# Patient Record
Sex: Male | Born: 1955
Health system: Southern US, Community
[De-identification: ages and names within clinical notes are randomized; demographics above are authoritative.]

## PROBLEM LIST (undated history)

## (undated) DIAGNOSIS — E785 Hyperlipidemia, unspecified: Secondary | ICD-10-CM

## (undated) DIAGNOSIS — G479 Sleep disorder, unspecified: Secondary | ICD-10-CM

## (undated) DIAGNOSIS — R05 Cough: Secondary | ICD-10-CM

## (undated) DIAGNOSIS — G5600 Carpal tunnel syndrome, unspecified upper limb: Secondary | ICD-10-CM

## (undated) DIAGNOSIS — R0600 Dyspnea, unspecified: Secondary | ICD-10-CM

## (undated) HISTORY — PX: CARPAL TUNNEL RELEASE: SHX101

## (undated) HISTORY — DX: Cough: R05

## (undated) HISTORY — PX: NASAL SINUS SURGERY: SHX719

## (undated) HISTORY — DX: Carpal tunnel syndrome, unspecified upper limb: G56.00

## (undated) HISTORY — DX: Sleep disorder, unspecified: G47.9

## (undated) HISTORY — DX: Hyperlipidemia, unspecified: E78.5

## (undated) HISTORY — DX: Dyspnea, unspecified: R06.00

---

## 1983-09-18 HISTORY — PX: HAND SURGERY: SHX662

## 2000-08-19 ENCOUNTER — Encounter (INDEPENDENT_AMBULATORY_CARE_PROVIDER_SITE_OTHER): Payer: Self-pay | Admitting: Specialist

## 2000-08-19 ENCOUNTER — Other Ambulatory Visit: Admission: RE | Admit: 2000-08-19 | Discharge: 2000-08-19 | Payer: Self-pay | Admitting: *Deleted

## 2000-08-24 ENCOUNTER — Emergency Department (HOSPITAL_COMMUNITY): Admission: EM | Admit: 2000-08-24 | Discharge: 2000-08-24 | Payer: Self-pay | Admitting: Emergency Medicine

## 2001-12-08 ENCOUNTER — Emergency Department (HOSPITAL_COMMUNITY): Admission: EM | Admit: 2001-12-08 | Discharge: 2001-12-08 | Payer: Self-pay | Admitting: Emergency Medicine

## 2002-09-28 ENCOUNTER — Emergency Department (HOSPITAL_COMMUNITY): Admission: EM | Admit: 2002-09-28 | Discharge: 2002-09-28 | Payer: Self-pay | Admitting: Emergency Medicine

## 2002-09-28 ENCOUNTER — Encounter: Payer: Self-pay | Admitting: Emergency Medicine

## 2002-10-09 ENCOUNTER — Ambulatory Visit (HOSPITAL_COMMUNITY): Admission: RE | Admit: 2002-10-09 | Discharge: 2002-10-09 | Payer: Self-pay | Admitting: Family Medicine

## 2002-10-09 ENCOUNTER — Encounter: Payer: Self-pay | Admitting: Family Medicine

## 2003-08-23 ENCOUNTER — Ambulatory Visit (HOSPITAL_BASED_OUTPATIENT_CLINIC_OR_DEPARTMENT_OTHER): Admission: RE | Admit: 2003-08-23 | Discharge: 2003-08-23 | Payer: Self-pay | Admitting: Orthopedic Surgery

## 2003-08-23 ENCOUNTER — Ambulatory Visit (HOSPITAL_COMMUNITY): Admission: RE | Admit: 2003-08-23 | Discharge: 2003-08-23 | Payer: Self-pay | Admitting: Orthopedic Surgery

## 2005-01-17 ENCOUNTER — Ambulatory Visit: Payer: Self-pay | Admitting: Gastroenterology

## 2005-02-23 ENCOUNTER — Ambulatory Visit: Payer: Self-pay | Admitting: Gastroenterology

## 2005-02-23 ENCOUNTER — Encounter (INDEPENDENT_AMBULATORY_CARE_PROVIDER_SITE_OTHER): Payer: Self-pay | Admitting: *Deleted

## 2008-05-14 ENCOUNTER — Ambulatory Visit (HOSPITAL_COMMUNITY): Admission: RE | Admit: 2008-05-14 | Discharge: 2008-05-14 | Payer: Self-pay | Admitting: Gastroenterology

## 2010-01-27 ENCOUNTER — Encounter (INDEPENDENT_AMBULATORY_CARE_PROVIDER_SITE_OTHER): Payer: Self-pay | Admitting: *Deleted

## 2010-10-17 NOTE — Letter (Signed)
Summary: Colonoscopy Letter  [facility]  [address] [place], [place] [address]   Phone: [phone]  Fax: [phone]      Jan 27, 2010 MRN: [id number]   Keith Wilson [address] [place], [place]  [address]   Dear [name],   According to your medical record, it is time for you to schedule a Colonoscopy. The American Cancer Society recommends this procedure as a method to detect early colon cancer. Patients with a family history of colon cancer, or a personal history of colon polyps or inflammatory bowel disease are at increased risk.  This letter has beeen generated based on the recommendations made at the time of your procedure. If you feel that in your particular situation this may no longer apply, please contact our office.  Please call our office at [phone] to schedule this appointment or to update your records at your earliest convenience.  Thank you for cooperating with [place] to provide you with the very best care possible.   Sincerely,  Judie Petit T. Russella Dar, M.D.  [facility] [phone]

## 2011-02-02 NOTE — Op Note (Signed)
NAME:  Keith Wilson, Keith Wilson                        ACCOUNT NO.:  [id number]   MEDICAL RECORD NO.:  [id number]                   PATIENT TYPE:  AMB   LOCATION:  DSC                                  FACILITY:  [facility]   PHYSICIAN:  Robert A. Thurston Hole, M.D.              DATE OF BIRTH:  [birth year]   DATE OF PROCEDURE:  08/23/2003  DATE OF DISCHARGE:                                 OPERATIVE REPORT   PREOPERATIVE DIAGNOSIS:  1. Left carpal tunnel syndrome.  2. Right carpal tunnel syndrome.   POSTOPERATIVE DIAGNOSIS:  1. Left carpal tunnel syndrome.  2. Right carpal tunnel syndrome.   PROCEDURE:  1. Left carpal tunnel release.  2. Right carpal tunnel release.   SURGEON:  Elana Alm. Thurston Hole, M.D.   ASSISTANT:  Julien Girt, P.A.   ANESTHESIA:  General.   OPERATIVE TIME:  40 minutes.   COMPLICATIONS:  None.   INDICATIONS FOR PROCEDURE:  Mr. Dibello is a 55 year old gentleman  who has  had significant painful progressive bilateral carpal tunnel syndrome that  has not been resolved by conservative care and he is now to undergo  bilateral carpal tunnel release. This has been documented by EMG, PNCV  evaluations.   DESCRIPTION OF PROCEDURE:  Mr. Fillinger was brought to the operating room on  August 23, 2003, and placed on the operating table in the supine position.  After an adequate level of general anesthesia was obtained, both hands and  arms were prepped using sterile Duraprep and draped using sterile technique.  He received 1 gm of Ancef IV preoperatively for prophylaxis.   Initially the right hand was addressed. The right hand and arm were prepped  and draped and the exsanguinated and a forearm tourniquet was elevated to  250 mmHg.  Initially through a 3 to 4-cm palmar incision, initial exposure  was made. The underlying subcutaneous tissues were incised along with the  skin incision. The transverse carpal ligament was exposed at the level  of  the wrist flexion crease,  carefully  incising this and  protecting the  underlying median nerve with a hemostat. The entire transverse carpal  ligament was released distally to the level of the superficial palmar arch,  carefully protecting this and released proximally  approximately  3 inches  proximal  to the wrist flexion crease, carefully  protecting the palmar  cutaneous branch of the median nerve.   The median nerve was found to be significantly flattened and compressed but  no other  pathology was noted. The wound was then irrigated and closed using  interrupted 3-0 nylon sutures. The wound was injected with 0.25% Marcaine.  After this was done, sterile compressive dressings were applied and the  tourniquet was released.   Attention was turned to the left hand. The arm was exsanguinated and a  forearm tourniquet elevated to 250 mmHg.  Initially through a 4-cm palmar  incision, initial exposure was made. The  underlying subcutaneous tissues  were incised along with the skin  incision. The transverse carpal ligament  was exposed at the level of the wrist flexion crease and incised. A hemostat  was used to protect the median nerve while the entire transverse carpal  ligament was released distally to the level of the superficial palmar arch,  carefully protecting this and released proximally approximately 3 inches  proximal to the wrist flexion crease, carefully protecting the palmar  cutaneous branch of the median nerve.   Again the median nerve was found to be significantly flattened and  compressed but no other  pathology was noted. At this point this wound was  irrigated then closed using interrupted 3-0 nylon suture and injected with  0.25% Marcaine. Sterile compressive dressings were applied. The tourniquet  was released.   The patient was then awakened and taken to the recovery room in stable  condition. For followup care Mr. Biederman will be followed  as an outpatient  on Percocet  and Naprosyn. He  will see me back in the office in one week for  sutures out and followup.                                               Robert A. Thurston Hole, M.D.    RAW/MEDQ  D:  08/23/2003  T:  08/23/2003  Job:  [id number]

## 2011-04-26 ENCOUNTER — Telehealth: Payer: Self-pay

## 2011-04-26 NOTE — Telephone Encounter (Signed)
Called patient to schedule recall Colonoscopy and the home phone number did not ring and the work number states the patient no longer works there.

## 2012-04-11 ENCOUNTER — Encounter: Payer: Self-pay | Admitting: Gastroenterology

## 2012-10-15 ENCOUNTER — Other Ambulatory Visit: Payer: Self-pay | Admitting: Physician Assistant

## 2014-09-15 ENCOUNTER — Other Ambulatory Visit (HOSPITAL_COMMUNITY): Payer: Self-pay | Admitting: Physician Assistant

## 2014-09-15 ENCOUNTER — Encounter (HOSPITAL_COMMUNITY): Payer: Self-pay | Admitting: *Deleted

## 2014-09-15 DIAGNOSIS — R0609 Other forms of dyspnea: Principal | ICD-10-CM

## 2014-09-15 DIAGNOSIS — R0600 Dyspnea, unspecified: Secondary | ICD-10-CM

## 2014-09-23 ENCOUNTER — Encounter (HOSPITAL_COMMUNITY): Payer: Self-pay | Admitting: *Deleted

## 2014-10-06 ENCOUNTER — Telehealth (HOSPITAL_COMMUNITY): Payer: Self-pay

## 2014-10-06 NOTE — Telephone Encounter (Signed)
Encounter complete. 

## 2014-10-07 ENCOUNTER — Telehealth (HOSPITAL_COMMUNITY): Payer: Self-pay

## 2014-10-07 NOTE — Telephone Encounter (Signed)
Encounter complete. 

## 2014-10-08 ENCOUNTER — Encounter (HOSPITAL_COMMUNITY): Payer: Self-pay

## 2014-10-11 ENCOUNTER — Encounter (HOSPITAL_COMMUNITY): Payer: Self-pay

## 2014-12-15 ENCOUNTER — Telehealth (HOSPITAL_COMMUNITY): Payer: Self-pay

## 2014-12-15 NOTE — Telephone Encounter (Signed)
Encounter complete. 

## 2014-12-16 ENCOUNTER — Ambulatory Visit (HOSPITAL_COMMUNITY)
Admission: RE | Admit: 2014-12-16 | Discharge: 2014-12-16 | Disposition: A | Discharge status: Home / Self Care | Payer: BLUE CROSS/BLUE SHIELD | Source: Ambulatory Visit | Attending: Physician Assistant | Admitting: Physician Assistant

## 2014-12-16 DIAGNOSIS — R0600 Dyspnea, unspecified: Secondary | ICD-10-CM

## 2014-12-16 DIAGNOSIS — R0609 Other forms of dyspnea: Secondary | ICD-10-CM

## 2014-12-16 NOTE — Procedures (Signed)
Exercise Treadmill Test  Test  Exercise Tolerance Test Ordering MD: Lovenia KimMark Hepler, PA-C  Interpreting MD:   Unique Test No: 1 Treadmill:  1  Indication for ETT: exertional dyspnea  Contraindication to ETT: No   Stress Modality: exercise - treadmill  Cardiac Imaging Performed: non   Protocol: standard Bruce - maximal  Max BP:  166/81  Max MPHR (bpm):  164 85% MPR (bpm):  139  MPHR obtained (bpm):  169 % MPHR obtained:  103  Reached 85% MPHR (min:sec):  9:20 Total Exercise Time (min-sec):  11:00  Workload in METS:  13.40 Borg Scale:   Reason ETT Terminated:  fatigue    ST Segment Analysis At Rest: normal ST segments - no evidence of significant ST depression With Exercise: no evidence of significant ST depression  Other Information Arrhythmia:  No Angina during ETT:  absent (0) Quality of ETT:  diagnostic  ETT Interpretation:  normal - no evidence of ischemia by ST analysis  Comments: Excellent exercise tolerance No chest pain Normal BP response to exercise  Chrystie NoseKenneth C. Loy Little, MD, [facility] SystemFACC Attending Cardiologist [facility]

## 2014-12-23 ENCOUNTER — Encounter (HOSPITAL_COMMUNITY): Payer: Self-pay

## 2015-10-18 ENCOUNTER — Institutional Professional Consult (permissible substitution): Payer: BLUE CROSS/BLUE SHIELD | Admitting: Internal Medicine

## 2015-10-27 ENCOUNTER — Encounter: Payer: Self-pay | Admitting: Internal Medicine

## 2015-10-27 ENCOUNTER — Ambulatory Visit (INDEPENDENT_AMBULATORY_CARE_PROVIDER_SITE_OTHER)
Admission: RE | Admit: 2015-10-27 | Discharge: 2015-10-27 | Disposition: A | Discharge status: Home / Self Care | Payer: BLUE CROSS/BLUE SHIELD | Source: Ambulatory Visit | Attending: Internal Medicine | Admitting: Internal Medicine

## 2015-10-27 ENCOUNTER — Ambulatory Visit (INDEPENDENT_AMBULATORY_CARE_PROVIDER_SITE_OTHER): Payer: BLUE CROSS/BLUE SHIELD | Admitting: Internal Medicine

## 2015-10-27 VITALS — BP 104/80 | HR 78 | Ht 72.0 in | Wt 206.0 lb

## 2015-10-27 DIAGNOSIS — R05 Cough: Secondary | ICD-10-CM | POA: Diagnosis not present

## 2015-10-27 DIAGNOSIS — R0600 Dyspnea, unspecified: Secondary | ICD-10-CM

## 2015-10-27 DIAGNOSIS — R058 Other specified cough: Secondary | ICD-10-CM

## 2015-10-27 DIAGNOSIS — R0609 Other forms of dyspnea: Secondary | ICD-10-CM | POA: Insufficient documentation

## 2015-10-27 HISTORY — DX: Other specified cough: R05.8

## 2015-10-27 HISTORY — DX: Dyspnea, unspecified: R06.00

## 2015-10-27 MED ORDER — FAMOTIDINE 20 MG PO TABS: ORAL_TABLET | ORAL | Status: AC

## 2015-10-27 MED ORDER — PANTOPRAZOLE SODIUM 40 MG PO TBEC: 40.0000 mg | DELAYED_RELEASE_TABLET | Freq: Every day | ORAL | Status: DC

## 2015-10-27 NOTE — Patient Instructions (Addendum)
Please see patient coordinator before you leave today  to schedule for sinus CT   Pantoprazole (protonix) 40 mg   Take  30-60 min before first meal of the day and Pepcid (famotidine)  20 mg one @  bedtime until return to office - this is the best way to tell whether stomach acid is contributing to your problem.    GERD (REFLUX)  is an extremely common cause of respiratory symptoms just like yours , many times with no obvious heartburn at all.    It can be treated with medication, but also with lifestyle changes including elevation of the head of your bed (ideally with 6 inch  bed blocks),  Smoking cessation, avoidance of late meals, excessive alcohol, and avoid fatty foods, chocolate, peppermint, colas, red wine, and acidic juices such as orange juice.  NO MINT OR MENTHOL PRODUCTS SO NO COUGH DROPS  USE SUGARLESS CANDY INSTEAD (Jolley ranchers or Stover's or Life Savers) or even ice chips will also do - the key is to swallow to prevent all throat clearing. NO OIL BASED VITAMINS - use powdered substitutes.  Please remember to go to the  x-ray department downstairs for your tests - we will call you with the results when they are available.     If not better in 2 weeks you need to call [facility]  for CPST with spirometry before and after - call [phone]

## 2015-10-27 NOTE — Progress Notes (Signed)
Subjective:    Patient ID: Keith Wilson, male    DOB: 24-Dec-1955,   MRN: [id number]  HPI  49 yowm quit smoking 2007 with h/o sinus problems requiring surgery but that helped a lot and no breathing problem when quit but onset of doe x winter 2016 already eval with neg gxt 11/2014 did not cause sob but fatigue but since then every time gets in a hurry sob or takes on incline  So referred to pulmonary clinic 10/27/2015 by Dr Tommi Emery with nl spirometry 10/27/2015    10/27/2015 1st [facility] office visit/ Kamir Selover   Chief Complaint  Patient presents with  . Pulmonary Consult    Referred by Lovenia Kim.  Pt c/o DOE for the past year. He gets SOB walking up any incline. He also c/o occ CP when he lies down.   occ noct cp / pos am cough congestion clear phlegm  Doe x one year min progression = MMRC1 = can walk nl pace, flat grade, can't hurry or go uphills or steps s sob   GXT 12/16/2014 nl ex tol, stopped due to fatigue, not sob  Neg pleuritic ap  No obvious other patterns in day to day or daytime variabilty or assoc  chest tightness, subjective wheeze overt sinus or hb symptoms. No unusual exp hx or h/o childhood pna/ asthma or knowledge of premature birth.  Sleeping ok without nocturnal  or early am exacerbation  of respiratory  c/o's or need for noct saba. Also denies any obvious fluctuation of symptoms with weather or environmental changes or other aggravating or alleviating factors except as outlined above   Current Medications, Allergies, Complete Past Medical History, Past Surgical History, Family History, and Social History were reviewed in [employer] record.               Review of Systems  Constitutional: Negative for fever, chills, activity change, appetite change and unexpected weight change.  HENT: Negative for congestion, dental problem, postnasal drip, rhinorrhea, sneezing, sore throat, trouble swallowing and voice change.   Eyes: Negative for visual  disturbance.  Respiratory: Positive for shortness of breath. Negative for cough and choking.   Cardiovascular: Positive for chest pain. Negative for leg swelling.  Gastrointestinal: Negative for nausea, vomiting and abdominal pain.  Genitourinary: Negative for difficulty urinating.  Musculoskeletal: Negative for arthralgias.  Skin: Negative for rash.  Psychiatric/Behavioral: Negative for behavioral problems and confusion.       Objective:   Physical Exam amb wm nad with severe nasal tone to voice and noisy nasal breathing heard across the room   Wt Readings from Last 3 Encounters:  10/27/15 206 lb (93.441 kg)    Vital signs reviewed    HEENT: nl dentition,  and oropharynx. Nl external ear canals without cough reflex - severe bilateral non specific turbinate edema   NECK :  without JVD/Nodes/TM/ nl carotid upstrokes bilaterally   LUNGS: no acc muscle use,  Nl contour chest which is clear to A and P bilaterally without cough on insp or exp maneuvers   CV:  RRR  no s3 or murmur or increase in P2, no edema   ABD:  soft and nontender with nl inspiratory excursion in the supine position. No bruits or organomegaly, bowel sounds nl  MS:  Nl gait/ ext warm without deformities, calf tenderness, cyanosis or clubbing No obvious joint restrictions   SKIN: warm and dry without lesions    NEURO:  alert, approp, nl sensorium with  no motor deficits      I personally reviewed images and agree with radiology impression as follows:  CXR:  10/27/2015  Cardiac shadow is stable. The lungs are well aerated bilaterally. Mild deformity of the anterior aspect of the left sixth rib is noted this is stable in appearance from the prior exam. No acute abnormality is noted.       Assessment & Plan:

## 2015-10-28 ENCOUNTER — Encounter: Payer: Self-pay | Admitting: Internal Medicine

## 2015-10-28 NOTE — Assessment & Plan Note (Addendum)
12/16/14   GXT  = nl ex tolerance/ stopped due to fatigue/ not sob  10/27/2015  Walked RA x 3 laps @ 185 ft each stopped due to End of study,   fast  pace, no sob or desat - Spirometry 10/27/2015  Completely wnl    Symptoms are markedly disproportionate to objective findings and not clear this is a lung problem but pt does appear to have difficult airway management issues.   DDX of  difficult airways management almost all start with A and  include Adherence, Ace Inhibitors, Acid Reflux, Active Sinus Disease, Alpha 1 Antitripsin deficiency, Anxiety masquerading as Airways dz,  ABPA,  Allergy(esp in young), Aspiration (esp in elderly), Adverse effects of meds,  Active smokers, A bunch of PE's (a small clot burden can't cause this syndrome unless there is already severe underlying pulm or vascular dz with poor reserve) plus two Bs  = Bronchiectasis and Beta blocker use..and one C= CHF  ? Acid (or non-acid) GERD > always difficult to exclude as up to 75% of pts in some series report no assoc GI/ Heartburn symptoms and this may explain some of his noct cps also since don't occur with ex > rec max (24h)  acid suppression and diet restrictions/ reviewed and instructions given in writing.   ? Allergies/ asthma very unlikely s variability with environment  ? Anxiety > usually at the bottom of this list of usual suspects but should be much higher on this pt's based on H and P and note already on psychotropics .   ? Active sinus dz > ct sinus   ? Chf/ cardiac > ruled out already by Cards   I had an extended discussion with the patient reviewing all relevant studies completed to date and  lasting 35  minutes of a 60  minute visit    Each maintenance medication was reviewed in detail including most importantly the difference between maintenance and prns and under what circumstances the prns are to be triggered using an action plan format that is not reflected in the computer generated alphabetically organized  AVS.    Please see instructions for details which were reviewed in writing and the patient given a copy highlighting the part that I personally wrote and discussed at today's ov.

## 2015-10-28 NOTE — Progress Notes (Signed)
Quick Note:  LMTCB ______ 

## 2015-10-28 NOTE — Assessment & Plan Note (Signed)
His nasal exam is the most impressive finding > check sinus CT ? Consider allergy eval

## 2015-10-31 NOTE — Progress Notes (Signed)
Quick Note:  Spoke with pt and notified of results per Dr. Wert. Pt verbalized understanding and denied any questions.  ______ 

## 2015-11-01 ENCOUNTER — Inpatient Hospital Stay: Admission: RE | Admit: 2015-11-01 | Payer: BLUE CROSS/BLUE SHIELD | Source: Ambulatory Visit

## 2016-09-15 DIAGNOSIS — H5213 Myopia, bilateral: Secondary | ICD-10-CM | POA: Diagnosis not present

## 2016-09-15 DIAGNOSIS — H52223 Regular astigmatism, bilateral: Secondary | ICD-10-CM | POA: Diagnosis not present

## 2016-11-21 DIAGNOSIS — Z125 Encounter for screening for malignant neoplasm of prostate: Secondary | ICD-10-CM | POA: Diagnosis not present

## 2016-11-21 DIAGNOSIS — Z0000 Encounter for general adult medical examination without abnormal findings: Secondary | ICD-10-CM | POA: Diagnosis not present

## 2016-11-21 DIAGNOSIS — E782 Mixed hyperlipidemia: Secondary | ICD-10-CM | POA: Diagnosis not present

## 2017-01-21 DIAGNOSIS — R972 Elevated prostate specific antigen [PSA]: Secondary | ICD-10-CM | POA: Diagnosis not present

## 2017-01-21 DIAGNOSIS — N401 Enlarged prostate with lower urinary tract symptoms: Secondary | ICD-10-CM | POA: Diagnosis not present

## 2017-01-21 DIAGNOSIS — R350 Frequency of micturition: Secondary | ICD-10-CM | POA: Diagnosis not present

## 2017-05-29 DIAGNOSIS — L02219 Cutaneous abscess of trunk, unspecified: Secondary | ICD-10-CM | POA: Diagnosis not present

## 2017-11-14 DIAGNOSIS — J018 Other acute sinusitis: Secondary | ICD-10-CM | POA: Diagnosis not present

## 2017-11-27 DIAGNOSIS — Z131 Encounter for screening for diabetes mellitus: Secondary | ICD-10-CM | POA: Diagnosis not present

## 2017-11-27 DIAGNOSIS — J069 Acute upper respiratory infection, unspecified: Secondary | ICD-10-CM | POA: Diagnosis not present

## 2017-11-27 DIAGNOSIS — Z125 Encounter for screening for malignant neoplasm of prostate: Secondary | ICD-10-CM | POA: Diagnosis not present

## 2017-11-27 DIAGNOSIS — E782 Mixed hyperlipidemia: Secondary | ICD-10-CM | POA: Diagnosis not present

## 2017-11-27 DIAGNOSIS — Z0000 Encounter for general adult medical examination without abnormal findings: Secondary | ICD-10-CM | POA: Diagnosis not present

## 2017-12-02 DIAGNOSIS — G479 Sleep disorder, unspecified: Secondary | ICD-10-CM

## 2017-12-02 DIAGNOSIS — G5600 Carpal tunnel syndrome, unspecified upper limb: Secondary | ICD-10-CM

## 2017-12-02 HISTORY — DX: Carpal tunnel syndrome, unspecified upper limb: G56.00

## 2017-12-02 HISTORY — DX: Sleep disorder, unspecified: G47.9

## 2017-12-05 IMAGING — DX DG CHEST 2V
2 series · 2 of 2 positions shown · non-contrast
Comparison: 04/13/2013

CLINICAL DATA: Dyspnea on exertion for 1 year

EXAM:
CHEST  2 VIEW

[chest pa]
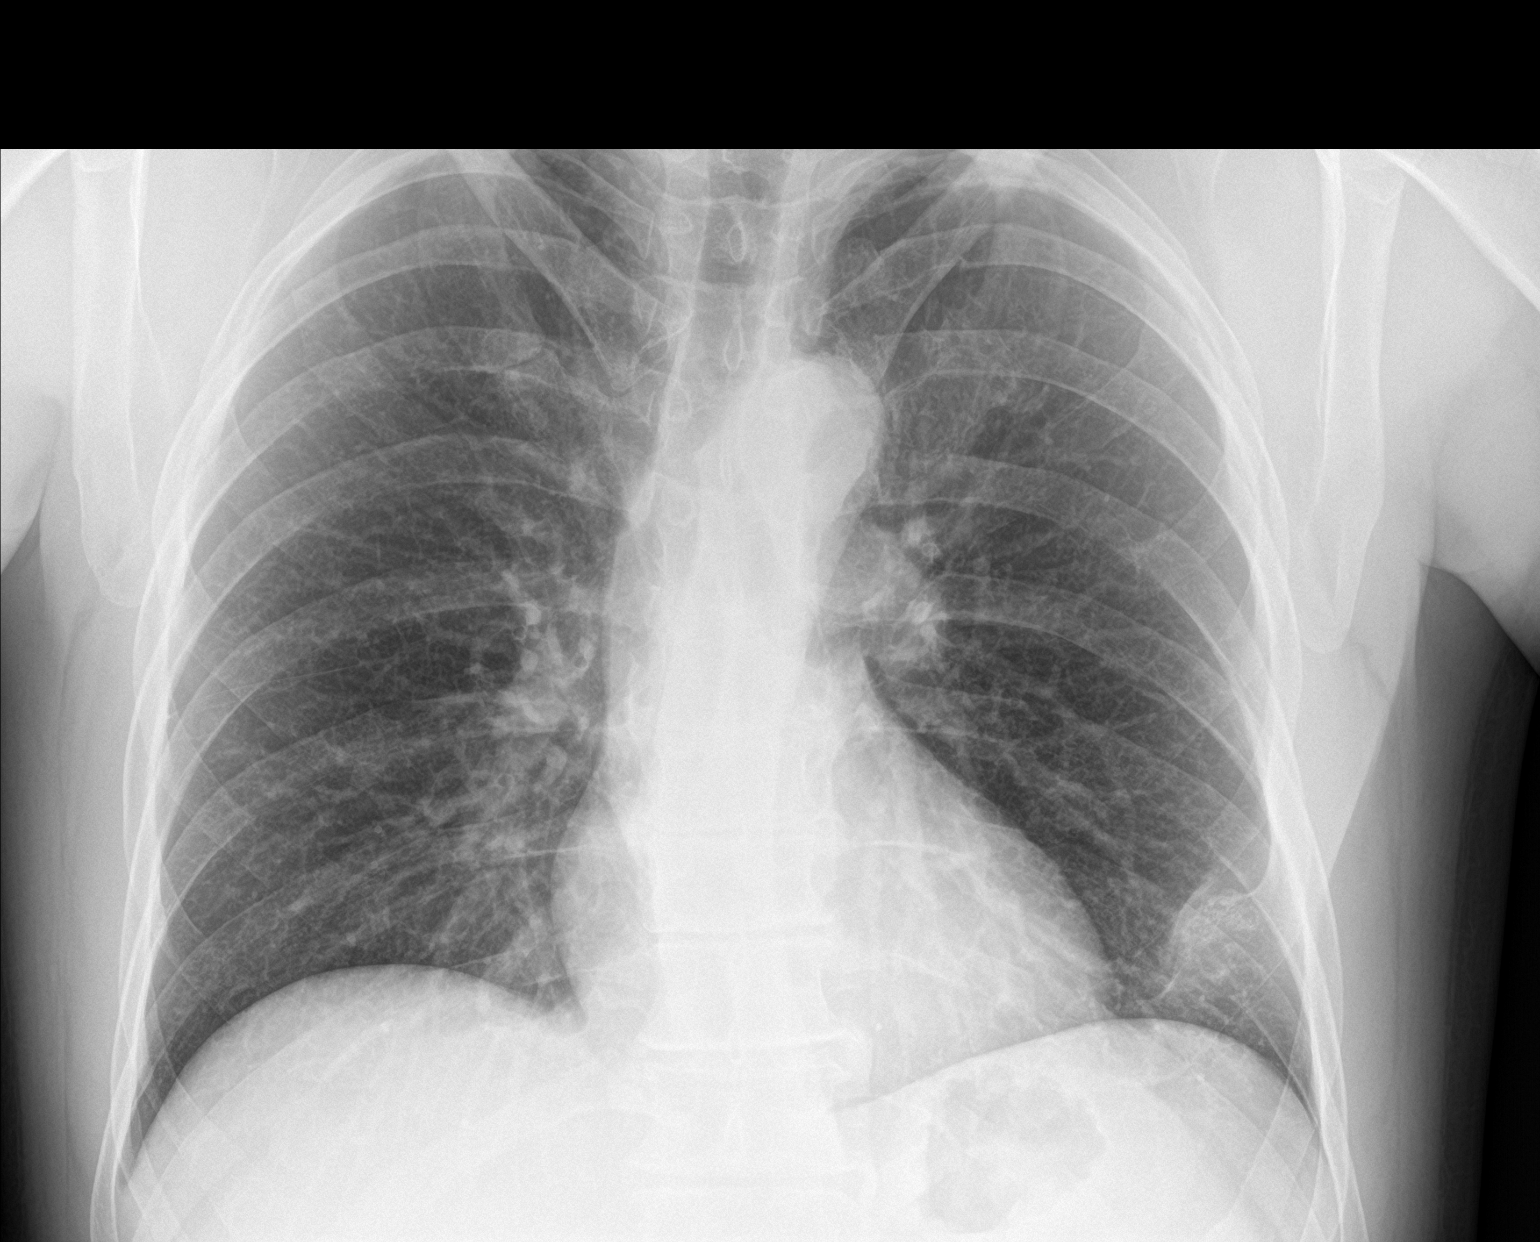

[chest lat]
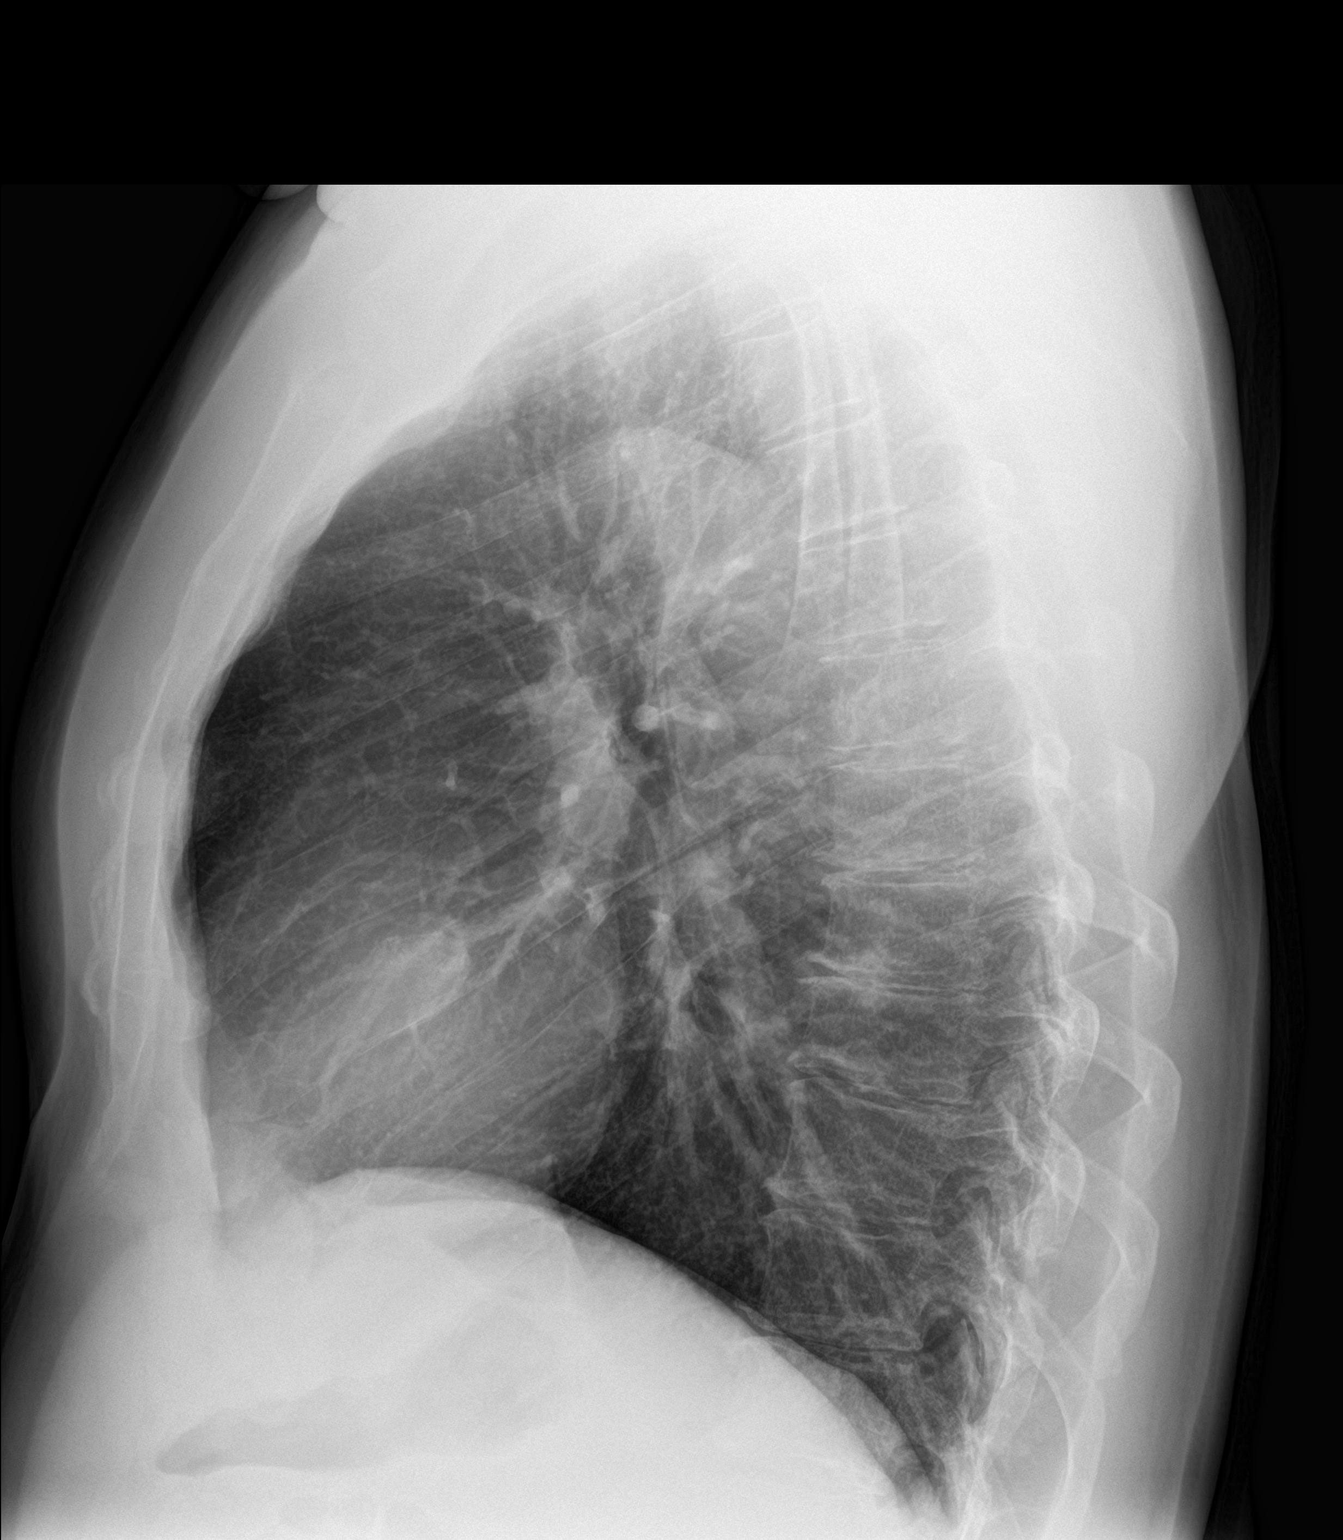

[2 of 2 positions shown; findings below may reference images not displayed]

FINDINGS: Cardiac shadow is stable. The lungs are well aerated bilaterally.
Mild deformity of the anterior aspect of the left sixth rib is noted
this is stable in appearance from the prior exam. No acute
abnormality is noted.
IMPRESSION: No active cardiopulmonary disease.

## 2018-01-03 ENCOUNTER — Ambulatory Visit: Payer: BLUE CROSS/BLUE SHIELD | Admitting: Cardiology

## 2018-11-13 DIAGNOSIS — J0100 Acute maxillary sinusitis, unspecified: Secondary | ICD-10-CM | POA: Diagnosis not present

## 2018-12-01 DIAGNOSIS — Z125 Encounter for screening for malignant neoplasm of prostate: Secondary | ICD-10-CM | POA: Diagnosis not present

## 2018-12-01 DIAGNOSIS — E782 Mixed hyperlipidemia: Secondary | ICD-10-CM | POA: Diagnosis not present

## 2018-12-01 DIAGNOSIS — R7303 Prediabetes: Secondary | ICD-10-CM | POA: Diagnosis not present

## 2018-12-01 DIAGNOSIS — Z0000 Encounter for general adult medical examination without abnormal findings: Secondary | ICD-10-CM | POA: Diagnosis not present

## 2018-12-03 ENCOUNTER — Other Ambulatory Visit: Payer: Self-pay | Admitting: Physician Assistant

## 2018-12-03 DIAGNOSIS — R200 Anesthesia of skin: Secondary | ICD-10-CM

## 2019-01-13 ENCOUNTER — Other Ambulatory Visit: Payer: BLUE CROSS/BLUE SHIELD

## 2019-02-02 ENCOUNTER — Other Ambulatory Visit: Payer: BLUE CROSS/BLUE SHIELD

## 2019-03-28 DIAGNOSIS — Z20828 Contact with and (suspected) exposure to other viral communicable diseases: Secondary | ICD-10-CM | POA: Diagnosis not present

## 2019-06-01 DIAGNOSIS — G479 Sleep disorder, unspecified: Secondary | ICD-10-CM | POA: Diagnosis not present

## 2019-06-01 DIAGNOSIS — R208 Other disturbances of skin sensation: Secondary | ICD-10-CM | POA: Diagnosis not present

## 2019-06-01 DIAGNOSIS — Z23 Encounter for immunization: Secondary | ICD-10-CM | POA: Diagnosis not present

## 2019-06-17 ENCOUNTER — Other Ambulatory Visit: Payer: Self-pay | Admitting: Physician Assistant

## 2019-06-17 DIAGNOSIS — R208 Other disturbances of skin sensation: Secondary | ICD-10-CM

## 2019-06-17 DIAGNOSIS — R200 Anesthesia of skin: Secondary | ICD-10-CM

## 2019-06-23 ENCOUNTER — Other Ambulatory Visit: Payer: BLUE CROSS/BLUE SHIELD

## 2019-06-25 DIAGNOSIS — Y990 Civilian activity done for income or pay: Secondary | ICD-10-CM | POA: Diagnosis not present

## 2019-06-25 DIAGNOSIS — I10 Essential (primary) hypertension: Secondary | ICD-10-CM | POA: Diagnosis not present

## 2019-06-25 DIAGNOSIS — W228XXA Striking against or struck by other objects, initial encounter: Secondary | ICD-10-CM | POA: Diagnosis not present

## 2019-06-25 DIAGNOSIS — S0990XA Unspecified injury of head, initial encounter: Secondary | ICD-10-CM | POA: Diagnosis not present

## 2019-06-25 DIAGNOSIS — R42 Dizziness and giddiness: Secondary | ICD-10-CM | POA: Diagnosis not present

## 2019-06-25 DIAGNOSIS — S0181XA Laceration without foreign body of other part of head, initial encounter: Secondary | ICD-10-CM | POA: Diagnosis not present

## 2019-07-01 DIAGNOSIS — N401 Enlarged prostate with lower urinary tract symptoms: Secondary | ICD-10-CM | POA: Diagnosis not present

## 2019-07-01 DIAGNOSIS — R3912 Poor urinary stream: Secondary | ICD-10-CM | POA: Diagnosis not present

## 2019-07-01 DIAGNOSIS — R972 Elevated prostate specific antigen [PSA]: Secondary | ICD-10-CM | POA: Diagnosis not present

## 2019-07-01 DIAGNOSIS — N5201 Erectile dysfunction due to arterial insufficiency: Secondary | ICD-10-CM | POA: Diagnosis not present

## 2019-07-03 DIAGNOSIS — N5201 Erectile dysfunction due to arterial insufficiency: Secondary | ICD-10-CM | POA: Diagnosis not present

## 2019-08-04 ENCOUNTER — Ambulatory Visit
Admission: RE | Admit: 2019-08-04 | Discharge: 2019-08-04 | Disposition: A | Discharge status: Home / Self Care | Payer: BC Managed Care – PPO | Source: Ambulatory Visit | Attending: Physician Assistant | Admitting: Physician Assistant

## 2019-08-04 DIAGNOSIS — M79671 Pain in right foot: Secondary | ICD-10-CM | POA: Diagnosis not present

## 2019-08-04 DIAGNOSIS — R200 Anesthesia of skin: Secondary | ICD-10-CM

## 2019-08-04 DIAGNOSIS — M79672 Pain in left foot: Secondary | ICD-10-CM | POA: Diagnosis not present

## 2019-08-04 DIAGNOSIS — R208 Other disturbances of skin sensation: Secondary | ICD-10-CM

## 2019-09-15 DIAGNOSIS — H5203 Hypermetropia, bilateral: Secondary | ICD-10-CM | POA: Diagnosis not present

## 2019-09-20 DIAGNOSIS — H5213 Myopia, bilateral: Secondary | ICD-10-CM | POA: Diagnosis not present

## 2019-12-03 DIAGNOSIS — Z0000 Encounter for general adult medical examination without abnormal findings: Secondary | ICD-10-CM | POA: Diagnosis not present

## 2019-12-03 DIAGNOSIS — J3089 Other allergic rhinitis: Secondary | ICD-10-CM | POA: Diagnosis not present

## 2020-04-15 ENCOUNTER — Emergency Department (HOSPITAL_COMMUNITY)
Admission: EM | Admit: 2020-04-15 | Discharge: 2020-04-15 | Disposition: A | Discharge status: Home / Self Care | Payer: BC Managed Care – PPO | Attending: Emergency Medicine | Admitting: Emergency Medicine

## 2020-04-15 ENCOUNTER — Other Ambulatory Visit: Payer: Self-pay

## 2020-04-15 ENCOUNTER — Encounter (HOSPITAL_COMMUNITY): Payer: Self-pay

## 2020-04-15 DIAGNOSIS — R110 Nausea: Secondary | ICD-10-CM | POA: Diagnosis not present

## 2020-04-15 DIAGNOSIS — R519 Headache, unspecified: Secondary | ICD-10-CM | POA: Diagnosis not present

## 2020-04-15 DIAGNOSIS — Z87891 Personal history of nicotine dependence: Secondary | ICD-10-CM | POA: Diagnosis not present

## 2020-04-15 LAB — I-STAT CHEM 8, ED
BUN: 18 mg/dL (ref 8–23)
Calcium, Ion: 1.09 mmol/L — ABNORMAL LOW (ref 1.15–1.40)
Chloride: 101 mmol/L (ref 98–111)
Creatinine, Ser: 0.8 mg/dL (ref 0.61–1.24)
Glucose, Bld: 115 mg/dL — ABNORMAL HIGH (ref 70–99)
HCT: 42 % (ref 39.0–52.0)
Hemoglobin: 14.3 g/dL (ref 13.0–17.0)
Potassium: 4 mmol/L (ref 3.5–5.1)
Sodium: 138 mmol/L (ref 135–145)
TCO2: 21 mmol/L — ABNORMAL LOW (ref 22–32)

## 2020-04-15 MED ORDER — ONDANSETRON 4 MG PO TBDP
4.0000 mg | ORAL_TABLET | Freq: Once | ORAL | Status: AC
  Administered 2020-04-15: 4 mg via ORAL
  Filled 2020-04-15: qty 1

## 2020-04-15 MED ORDER — ONDANSETRON HCL 4 MG PO TABS: 4.0000 mg | ORAL_TABLET | Freq: Four times a day (QID) | ORAL | 0 refills | Status: DC

## 2020-04-15 MED ORDER — BENZONATATE 100 MG PO CAPS: 100.0000 mg | ORAL_CAPSULE | Freq: Three times a day (TID) | ORAL | 0 refills | Status: AC

## 2020-04-15 NOTE — Discharge Instructions (Signed)
You were given a prescription for zofran to help with your nausea. Please take as directed.  Please follow up with your primary care provider within 5-7 days for re-evaluation of your symptoms. If you do not have a primary care provider, information for a healthcare clinic has been provided for you to make arrangements for follow up care. Please return to the emergency department for any new or worsening symptoms.  

## 2020-04-15 NOTE — ED Triage Notes (Signed)
Patient states he was Covid + 9 days ago. Patient c/o headache, body aches, and nausea.

## 2020-04-15 NOTE — ED Provider Notes (Signed)
[facility]-EMERGENCY DEPT Provider Note   CSN: [id number] Arrival date & time: 04/15/20  3212     History Chief Complaint  Patient presents with  . Nausea    Keith Wilson is a 64 y.o. male.  HPI   64 year old male presenting for evaluation of Covid symptoms.  Was diagnosed with Covid about 9 days ago.  He is complaining of headache, body aches, nausea.  Denies any vomiting.  Has had some diarrhea.  States that he is still feeling poorly and has not been able to eat due to his nausea.  He has tried taking over-the-counter medications without relief of symptoms.  He denies any shortness of breath.  Denies any significant chest pain.  Past Medical History:  Diagnosis Date  . Carpal tunnel syndrome 12/02/2017  . Dyspnea 10/27/2015   12/16/14   GXT  = nl ex tolerance/ stopped due to fatigue/ not sob  10/27/2015  Walked RA x 3 laps @ 185 ft each stopped due to End of study,   fast  pace, no sob or desat - Spirometry 10/27/2015  Completely wnl          . Hyperlipidemia   . Sleep disorder 12/02/2017  . Upper airway cough syndrome 10/27/2015   Sinus CT 10/27/2015      Patient Active Problem List   Diagnosis Date Noted  . Carpal tunnel syndrome 12/02/2017  . Sleep disorder 12/02/2017  . Dyspnea on exertion 10/27/2015  . Upper airway cough syndrome 10/27/2015    Past Surgical History:  Procedure Laterality Date  . CARPAL TUNNEL RELEASE    . HAND SURGERY  1985  . NASAL SINUS SURGERY         Family History  Problem Relation Age of Onset  . Heart disease Mother   . Thyroid cancer Mother   . High Cholesterol Mother   . Stroke Mother   . Hypertension Mother   . CAD Mother   . Heart disease Maternal Grandfather   . CVA Maternal Grandfather   . CAD Maternal Grandfather   . Heart disease Maternal Grandmother   . Thyroid cancer Maternal Grandmother   . CVA Maternal Grandmother   . CAD Maternal Grandmother   . Prostate cancer Father   . CVA Paternal Grandfather      Social History   Tobacco Use  . Smoking status: Former Smoker    Packs/day: 1.00    Years: 30.00    Pack years: 30.00    Types: Cigarettes    Quit date: 09/17/2005    Years since quitting: 14.5  . Smokeless tobacco: Never Used  Vaping Use  . Vaping Use: Never used  Substance Use Topics  . Alcohol use: No    Alcohol/week: 0.0 standard drinks  . Drug use: No    Home Medications Prior to Admission medications   Medication Sig Start Date End Date Taking? Authorizing Provider  ALPRAZolam Prudy Feeler) 0.5 MG tablet Take 1 tablet by mouth at bedtime as needed. 10/08/15   [provider]  benzonatate (TESSALON) 100 MG capsule Take 1 capsule (100 mg total) by mouth every 8 (eight) hours for 5 days. 04/15/20 04/20/20  Manette Doto S, PA-C  famotidine (PEPCID) 20 MG tablet One at bedtime 10/27/15   Nyoka Cowden, MD  ondansetron (ZOFRAN) 4 MG tablet Take 1 tablet (4 mg total) by mouth every 6 (six) hours. 04/15/20   Rylie Knierim S, PA-C  pantoprazole (PROTONIX) 40 MG tablet Take 1 tablet (40 mg  total) by mouth daily. Take 30-60 min before first meal of the day 10/27/15   Nyoka Cowden, MD  VIAGRA 100 MG tablet Take 1 tablet by mouth daily as needed. 10/07/15   [provider]    Allergies    Patient has no known allergies.  Review of Systems   Review of Systems  Constitutional: Positive for appetite change and fatigue.  HENT: Negative for ear pain and sore throat.   Eyes: Negative for visual disturbance.  Respiratory: Positive for cough. Negative for shortness of breath.   Cardiovascular: Negative for chest pain.  Gastrointestinal: Positive for diarrhea and nausea. Negative for abdominal pain, constipation and vomiting.  Genitourinary: Negative for dysuria.  Musculoskeletal: Positive for myalgias.  Skin: Negative for rash.  Neurological: Positive for headaches.  All other systems reviewed and are negative.   Physical Exam Updated Vital Signs BP (!) 119/90    Pulse 86   Temp 98.4 F (36.9 C) (Oral)   Resp 18   Ht 6' (1.829 m)   Wt 76.7 kg   SpO2 100%   BMI 22.92 kg/m   Physical Exam Vitals and nursing note reviewed.  Constitutional:      Appearance: He is well-developed.  HENT:     Head: Normocephalic and atraumatic.     Mouth/Throat:     Mouth: Mucous membranes are dry.  Eyes:     Conjunctiva/sclera: Conjunctivae normal.  Cardiovascular:     Rate and Rhythm: Normal rate and regular rhythm.     Heart sounds: Normal heart sounds. No murmur heard.   Pulmonary:     Effort: Pulmonary effort is normal. No respiratory distress.     Breath sounds: Normal breath sounds. No wheezing, rhonchi or rales.  Abdominal:     General: Bowel sounds are normal.     Palpations: Abdomen is soft.     Tenderness: There is no abdominal tenderness. There is no guarding or rebound.  Musculoskeletal:     Cervical back: Neck supple.  Skin:    General: Skin is warm and dry.  Neurological:     Mental Status: He is alert.     ED Results / Procedures / Treatments   Labs (all labs ordered are listed, but only abnormal results are displayed) Labs Reviewed  I-STAT CHEM 8, ED - Abnormal; Notable for the following components:      Result Value   Glucose, Bld 115 (*)    Calcium, Ion 1.09 (*)    TCO2 21 (*)    All other components within normal limits    EKG None  Radiology No results found.  Procedures Procedures (including critical care time)  Medications Ordered in ED Medications  ondansetron (ZOFRAN-ODT) disintegrating tablet 4 mg (4 mg Oral Given 04/15/20 1025)    ED Course  I have reviewed the triage vital signs and the nursing notes.  Pertinent labs & imaging results that were available during my care of the patient were reviewed by me and considered in my medical decision making (see chart for details).    MDM Rules/Calculators/A&P                          64 year old male presenting for evaluation of nausea and decreased p.o.  intake secondary to this.  Diagnosed with Covid about 9 days ago.  On exam mucous membranes do appear dry.  He is given Zofran and encouraged to hydrate with oral fluids.  Will check Chem-8 to rule  out AKI or significant electrolyte disturbance due to his decreased p.o. intake and diarrhea.  Chem 8 w/o aki and without and acute electrolyte derangements  Pt able to tolerate po in the ED. feel his symptoms are likely continuation of his recent COVID diagnosis. Will give rx for zofran and benzonatate. Advised pcp f/u and return precautions. He voices understanding of the plan and reasons to return. All questions answered, pt stable for discharge.   Final Clinical Impression(s) / ED Diagnoses Final diagnoses:  Nausea    Rx / DC Orders ED Discharge Orders         Ordered    ondansetron (ZOFRAN) 4 MG tablet  Every 6 hours     Discontinue  Reprint     04/15/20 1220    benzonatate (TESSALON) 100 MG capsule  Every 8 hours     Discontinue  Reprint     04/15/20 [address], [place], PA-C 04/15/20 1231    Sabas Sous, MD 04/15/20 [phone]

## 2022-12-18 DIAGNOSIS — Z8042 Family history of malignant neoplasm of prostate: Secondary | ICD-10-CM | POA: Diagnosis not present

## 2022-12-18 DIAGNOSIS — N401 Enlarged prostate with lower urinary tract symptoms: Secondary | ICD-10-CM | POA: Diagnosis not present

## 2022-12-18 DIAGNOSIS — Z2839 Other underimmunization status: Secondary | ICD-10-CM | POA: Diagnosis not present

## 2022-12-18 DIAGNOSIS — R739 Hyperglycemia, unspecified: Secondary | ICD-10-CM | POA: Diagnosis not present

## 2022-12-18 DIAGNOSIS — R634 Abnormal weight loss: Secondary | ICD-10-CM | POA: Diagnosis not present

## 2022-12-18 DIAGNOSIS — N529 Male erectile dysfunction, unspecified: Secondary | ICD-10-CM | POA: Diagnosis not present

## 2022-12-18 DIAGNOSIS — Z0000 Encounter for general adult medical examination without abnormal findings: Secondary | ICD-10-CM | POA: Diagnosis not present

## 2022-12-18 DIAGNOSIS — Z125 Encounter for screening for malignant neoplasm of prostate: Secondary | ICD-10-CM | POA: Diagnosis not present

## 2022-12-18 DIAGNOSIS — E782 Mixed hyperlipidemia: Secondary | ICD-10-CM | POA: Diagnosis not present

## 2022-12-18 DIAGNOSIS — R0789 Other chest pain: Secondary | ICD-10-CM | POA: Diagnosis not present

## 2022-12-18 DIAGNOSIS — G479 Sleep disorder, unspecified: Secondary | ICD-10-CM | POA: Diagnosis not present

## 2022-12-28 ENCOUNTER — Encounter: Payer: Self-pay | Admitting: Cardiology

## 2022-12-28 ENCOUNTER — Ambulatory Visit: Payer: Medicare PPO | Admitting: Cardiology

## 2022-12-28 VITALS — BP 120/81 | HR 68 | Resp 16 | Ht 72.0 in | Wt 173.0 lb

## 2022-12-28 DIAGNOSIS — R0609 Other forms of dyspnea: Secondary | ICD-10-CM

## 2022-12-28 DIAGNOSIS — R072 Precordial pain: Secondary | ICD-10-CM | POA: Diagnosis not present

## 2022-12-28 NOTE — Progress Notes (Signed)
Patient referred by Lovenia Kim, PA-C for chest tightness  Subjective:   Keith Wilson, male    DOB: November 17, 1955, 67 y.o.   MRN: [id number]   Chief Complaint  Patient presents with   Chest Pain   New Patient (Initial Visit)    HPI  67 y.o. Caucasian male with hyperlipidemia, OSA, with chest tightness, exertional dyspnea  Patient is here with his wife today.  He is retired, does not do any regular exercise.  For last couple years, he has had progressive worsening exertional dyspnea with nearly any upper walking.  There is also associated with chest tightness that last for 15 to 20 minutes, and resolves with rest.  He last had a stress test in 2017 with 30 minutes and no ischemia on EKG.  In addition, he is also noticing night sweats and 10 pound unintentional weight loss in 1 year.  He has noticed black stools.  His last colonoscopy was 2 years ago, and was recommended 10-year follow-up.  He is a former smoker, quit in 2007.  Reviewed recent test results with the patient, details below.       Past Medical History:  Diagnosis Date   Carpal tunnel syndrome 12/02/2017   Dyspnea 10/27/2015   12/16/14   GXT  = nl ex tolerance/ stopped due to fatigue/ not sob  10/27/2015  Walked RA x 3 laps @ 185 ft each stopped due to End of study,   fast  pace, no sob or desat - Spirometry 10/27/2015  Completely wnl           Hyperlipidemia    Sleep disorder 12/02/2017   Upper airway cough syndrome 10/27/2015   Sinus CT 10/27/2015       Past Surgical History:  Procedure Laterality Date   CARPAL TUNNEL RELEASE     HAND SURGERY  1985   NASAL SINUS SURGERY       Social History   Tobacco Use  Smoking Status Former   Packs/day: 1.00   Years: 30.00   Additional pack years: 0.00   Total pack years: 30.00   Types: Cigarettes   Quit date: 09/17/2005   Years since quitting: 17.2  Smokeless Tobacco Never    Social History   Substance and Sexual Activity  Alcohol Use No   Alcohol/week: 0.0  standard drinks of alcohol     Family History  Problem Relation Age of Onset   Heart disease Mother    Thyroid cancer Mother    High Cholesterol Mother    Stroke Mother    Hypertension Mother    CAD Mother    Heart disease Maternal Grandfather    CVA Maternal Grandfather    CAD Maternal Grandfather    Heart disease Maternal Grandmother    Thyroid cancer Maternal Grandmother    CVA Maternal Grandmother    CAD Maternal Grandmother    Prostate cancer Father    CVA Paternal Grandfather       Current Outpatient Medications:    famotidine (PEPCID) 20 MG tablet, One at bedtime, Disp: 30 tablet, Rfl: 2   NON FORMULARY, 0.4 Prostaglandin Twice weekly, Disp: , Rfl:    Omega-3 Fatty Acids (FISH OIL) 500 MG CAPS, Take by mouth., Disp: , Rfl:    naproxen sodium (ALEVE) 220 MG tablet, Take 220 mg by mouth daily as needed., Disp: , Rfl:    pravastatin (PRAVACHOL) 80 MG tablet, 80 mg daily., Disp: , Rfl:    traZODone (DESYREL) 50 MG tablet, Take  50 mg by mouth at bedtime., Disp: , Rfl:    VIAGRA 100 MG tablet, Take 1 tablet by mouth daily as needed., Disp: , Rfl: 2   Cardiovascular and other pertinent studies:  Reviewed external labs and tests, independently interpreted  EKG 12/28/2022: Sinus rhythm 66 bpm  Normal EKG   GXT 2016: 13 METS No ischemia   Recent labs: 12/18/2022: Glucose 106, BUN/Cr 20/1.06. EGFR 78. Na/K 141/4.3. Rest of the CMP normal H/H 13/40. MCV 91. Platelets 165 HbA1C 5.5% Chol 143, TG 88, HDL 49, LDL 78 TSH 1.6 normal   Review of Systems  Cardiovascular:  Positive for chest pain and dyspnea on exertion. Negative for leg swelling, palpitations and syncope.         Vitals:   12/28/22 0941  BP: 120/81  Pulse: 68  Resp: 16  SpO2: 99%     Body mass index is 23.46 kg/m. Filed Weights   12/28/22 0941  Weight: 173 lb (78.5 kg)     Objective:   Physical Exam Vitals and nursing note reviewed.  Constitutional:      General: He is not in  acute distress. Neck:     Vascular: No JVD.  Cardiovascular:     Rate and Rhythm: Normal rate and regular rhythm.     Heart sounds: Normal heart sounds. No murmur heard. Pulmonary:     Effort: Pulmonary effort is normal.     Breath sounds: Normal breath sounds. No wheezing or rales.  Musculoskeletal:     Right lower leg: No edema.     Left lower leg: No edema.          Visit diagnoses:   ICD-10-CM   1. Precordial pain  R07.2 EKG 12-Lead    PCV MYOCARDIAL PERFUSION WO LEXISCAN    PCV ECHOCARDIOGRAM COMPLETE    CT CARDIAC SCORING (SELF PAY ONLY)       Orders Placed This Encounter  Procedures   CT CARDIAC SCORING (SELF PAY ONLY)   PCV MYOCARDIAL PERFUSION WO LEXISCAN   EKG 12-Lead   PCV ECHOCARDIOGRAM COMPLETE     Medication changes this visit: Medications Discontinued During This Encounter  Medication Reason   ALPRAZolam (XANAX) 0.5 MG tablet    ondansetron (ZOFRAN) 4 MG tablet    pantoprazole (PROTONIX) 40 MG tablet      Assessment & Recommendations:    67 y.o. Caucasian male with hyperlipidemia, OSA, with chest tightness, exertional dyspnea  Symptoms concerning for angina.  It is rather unusual that symptoms have been going on for several years with last exercise treadmill stress test normal in 2017. Recommend exercise nuclear stress test, echocardiogram, CT cardiac scoring. Also consider pulmonary evaluation, including chest x-ray and PFT.  Will defer this to PCP, with whom he has an appointment next week.  On a separate note, given his night sweats unintentional weight loss, he would also benefit from age-appropriate cancer screening, defer to PCP.  I did not appreciate any obvious lymphadenopathy on my exam.  Further recommendations after above testing    Thank you for referring the patient to [place]. Please feel free to contact with any questions.   Elder Negus, MD Pager: [phone] Office: [phone]

## 2023-01-04 DIAGNOSIS — R972 Elevated prostate specific antigen [PSA]: Secondary | ICD-10-CM | POA: Diagnosis not present

## 2023-01-10 ENCOUNTER — Ambulatory Visit: Payer: Medicare PPO

## 2023-01-10 ENCOUNTER — Encounter: Payer: Self-pay | Admitting: Cardiology

## 2023-01-10 DIAGNOSIS — R072 Precordial pain: Secondary | ICD-10-CM

## 2023-01-18 ENCOUNTER — Other Ambulatory Visit: Payer: Medicare PPO

## 2023-02-04 ENCOUNTER — Ambulatory Visit: Payer: Medicare PPO | Admitting: Cardiology

## 2023-02-13 ENCOUNTER — Ambulatory Visit: Payer: Medicare PPO

## 2023-02-13 DIAGNOSIS — R072 Precordial pain: Secondary | ICD-10-CM

## 2023-02-15 NOTE — Progress Notes (Unsigned)
Patient referred by Lovenia Kim, PA-C for chest tightness  Subjective:   Keith Wilson, male    DOB: 1955-11-11, 68 y.o.   MRN: [id number]   No chief complaint on file.   HPI  67 y.o. Caucasian male with hyperlipidemia, OSA, with chest tightness, exertional dyspnea  ***  Initial consultation visit 12/2022: Patient is here with his wife today.  He is retired, does not do any regular exercise.  For last couple years, he has had progressive worsening exertional dyspnea with nearly any upper walking.  There is also associated with chest tightness that last for 15 to 20 minutes, and resolves with rest.  He last had a stress test in 2017 with 30 minutes and no ischemia on EKG.  In addition, he is also noticing night sweats and 10 pound unintentional weight loss in 1 year.  He has noticed black stools.  His last colonoscopy was 2 years ago, and was recommended 10-year follow-up.  He is a former smoker, quit in 2007.  Reviewed recent test results with the patient, details below.      Current Outpatient Medications:    famotidine (PEPCID) 20 MG tablet, One at bedtime, Disp: 30 tablet, Rfl: 2   naproxen sodium (ALEVE) 220 MG tablet, Take 220 mg by mouth daily as needed., Disp: , Rfl:    NON FORMULARY, 0.4 Prostaglandin Twice weekly, Disp: , Rfl:    Omega-3 Fatty Acids (FISH OIL) 500 MG CAPS, Take by mouth., Disp: , Rfl:    pravastatin (PRAVACHOL) 80 MG tablet, 80 mg daily., Disp: , Rfl:    traZODone (DESYREL) 50 MG tablet, Take 50 mg by mouth at bedtime., Disp: , Rfl:    VIAGRA 100 MG tablet, Take 1 tablet by mouth daily as needed., Disp: , Rfl: 2   Cardiovascular and other pertinent studies:  Reviewed external labs and tests, independently interpreted  EKG 12/28/2022: Sinus rhythm 66 bpm  Normal EKG  Echocardiogram 02/13/2023: Left ventricle cavity is normal in size and ventricular wall thickness. Normal global wall motion. Normal LV systolic function with EF 59%. Normal  diastolic filling pattern.  Mildly dilated aortic root at sinus of Valsalva measuring 3.8 cm. aortic root is normal. Mildly dilated proximal ascending aorta measuring 3.9 cm. Structurally normal trileaflet aortic valve. Trace aortic regurgitation. Mild prolapse of the mitral valve leaflets. Mild (Grade I) mitral regurgitation. Structurally normal tricuspid valve. Mild tricuspid regurgitation. No evidence of pulmonary hypertension.     GXT 2016: 13 METS No ischemia   Recent labs: 12/18/2022: Glucose 106, BUN/Cr 20/1.06. EGFR 78. Na/K 141/4.3. Rest of the CMP normal H/H 13/40. MCV 91. Platelets 165 HbA1C 5.5% Chol 143, TG 88, HDL 49, LDL 78 TSH 1.6 normal   Review of Systems  Cardiovascular:  Positive for chest pain and dyspnea on exertion. Negative for leg swelling, palpitations and syncope.         There were no vitals filed for this visit.    There is no height or weight on file to calculate BMI. There were no vitals filed for this visit.    Objective:   Physical Exam Vitals and nursing note reviewed.  Constitutional:      General: He is not in acute distress. Neck:     Vascular: No JVD.  Cardiovascular:     Rate and Rhythm: Normal rate and regular rhythm.     Heart sounds: Normal heart sounds. No murmur heard. Pulmonary:     Effort: Pulmonary effort is normal.  Breath sounds: Normal breath sounds. No wheezing or rales.  Musculoskeletal:     Right lower leg: No edema.     Left lower leg: No edema.          Visit diagnoses: No diagnosis found.    No orders of the defined types were placed in this encounter.    Medication changes this visit: There are no discontinued medications.    Assessment & Recommendations:    67 y.o. Caucasian male with hyperlipidemia, OSA, with chest tightness, exertional dyspnea  Symptoms concerning for angina.  It is rather unusual that symptoms have been going on for several years with last exercise treadmill  stress test normal in 2017. Recommend exercise nuclear stress test, echocardiogram, CT cardiac scoring. Also consider pulmonary evaluation, including chest x-ray and PFT.  Will defer this to PCP, with whom he has an appointment next week.  On a separate note, given his night sweats unintentional weight loss, he would also benefit from age-appropriate cancer screening, defer to PCP.  I did not appreciate any obvious lymphadenopathy on my exam.  Further recommendations after above testing    Thank you for referring the patient to [place]. Please feel free to contact with any questions.   Elder Negus, MD Pager: [phone] Office: [phone]

## 2023-02-20 ENCOUNTER — Encounter: Payer: Self-pay | Admitting: Cardiology

## 2023-02-20 ENCOUNTER — Ambulatory Visit: Payer: Medicare PPO | Admitting: Cardiology

## 2023-02-20 VITALS — BP 116/71 | HR 72 | Resp 17 | Ht 72.0 in | Wt 174.0 lb

## 2023-02-20 DIAGNOSIS — I77810 Thoracic aortic ectasia: Secondary | ICD-10-CM

## 2023-02-20 DIAGNOSIS — R0609 Other forms of dyspnea: Secondary | ICD-10-CM

## 2023-02-20 DIAGNOSIS — R072 Precordial pain: Secondary | ICD-10-CM | POA: Diagnosis not present

## 2023-02-22 DIAGNOSIS — R972 Elevated prostate specific antigen [PSA]: Secondary | ICD-10-CM | POA: Diagnosis not present

## 2023-02-22 DIAGNOSIS — D075 Carcinoma in situ of prostate: Secondary | ICD-10-CM | POA: Diagnosis not present

## 2023-03-07 DIAGNOSIS — R310 Gross hematuria: Secondary | ICD-10-CM | POA: Diagnosis not present

## 2023-08-19 DIAGNOSIS — N5201 Erectile dysfunction due to arterial insufficiency: Secondary | ICD-10-CM | POA: Diagnosis not present

## 2023-08-19 DIAGNOSIS — R972 Elevated prostate specific antigen [PSA]: Secondary | ICD-10-CM | POA: Diagnosis not present

## 2023-12-03 DIAGNOSIS — R051 Acute cough: Secondary | ICD-10-CM | POA: Diagnosis not present

## 2023-12-03 DIAGNOSIS — J020 Streptococcal pharyngitis: Secondary | ICD-10-CM | POA: Diagnosis not present

## 2023-12-03 DIAGNOSIS — Z03818 Encounter for observation for suspected exposure to other biological agents ruled out: Secondary | ICD-10-CM | POA: Diagnosis not present

## 2023-12-16 DIAGNOSIS — G608 Other hereditary and idiopathic neuropathies: Secondary | ICD-10-CM | POA: Diagnosis not present

## 2023-12-17 DIAGNOSIS — R946 Abnormal results of thyroid function studies: Secondary | ICD-10-CM | POA: Diagnosis not present

## 2024-02-20 ENCOUNTER — Other Ambulatory Visit: Payer: Medicare PPO

## 2024-02-20 ENCOUNTER — Ambulatory Visit (HOSPITAL_COMMUNITY): Payer: Medicare (Managed Care)

## 2024-08-24 ENCOUNTER — Encounter: Payer: Self-pay | Admitting: Urology

## 2024-08-24 ENCOUNTER — Other Ambulatory Visit: Payer: Self-pay | Admitting: Urology

## 2024-08-24 DIAGNOSIS — R972 Elevated prostate specific antigen [PSA]: Secondary | ICD-10-CM

## 2024-09-29 ENCOUNTER — Encounter: Payer: Self-pay | Admitting: Urology

## 2024-10-02 ENCOUNTER — Ambulatory Visit
Admission: RE | Admit: 2024-10-02 | Discharge: 2024-10-02 | Disposition: A | Discharge status: Home / Self Care | Payer: Medicare (Managed Care) | Source: Ambulatory Visit | Attending: Urology | Admitting: Urology

## 2024-10-02 DIAGNOSIS — R972 Elevated prostate specific antigen [PSA]: Secondary | ICD-10-CM

## 2024-10-02 MED ORDER — GADOPICLENOL 0.5 MMOL/ML IV SOLN
8.0000 mL | Freq: Once | INTRAVENOUS | Status: AC | PRN
  Administered 2024-10-02: 8 mL via INTRAVENOUS
